# Patient Record
Sex: Male | Born: 1975 | Race: Black or African American | Hispanic: No | Marital: Single | Smoking: Current every day smoker
Health system: Southern US, Community
[De-identification: ages and names within clinical notes are randomized; demographics above are authoritative.]

---

## 2014-12-24 ENCOUNTER — Encounter: Payer: Self-pay | Admitting: Sports Medicine

## 2014-12-24 ENCOUNTER — Ambulatory Visit (INDEPENDENT_AMBULATORY_CARE_PROVIDER_SITE_OTHER): Payer: Managed Care, Other (non HMO)

## 2014-12-24 ENCOUNTER — Ambulatory Visit (INDEPENDENT_AMBULATORY_CARE_PROVIDER_SITE_OTHER): Payer: Managed Care, Other (non HMO) | Admitting: Sports Medicine

## 2014-12-24 VITALS — BP 121/69 | HR 73 | Ht 69.0 in | Wt 126.0 lb

## 2014-12-24 DIAGNOSIS — M62551 Muscle wasting and atrophy, not elsewhere classified, right thigh: Secondary | ICD-10-CM | POA: Insufficient documentation

## 2014-12-24 DIAGNOSIS — M25561 Pain in right knee: Secondary | ICD-10-CM

## 2014-12-24 DIAGNOSIS — M25461 Effusion, right knee: Secondary | ICD-10-CM

## 2014-12-24 DIAGNOSIS — M25462 Effusion, left knee: Secondary | ICD-10-CM

## 2014-12-24 MED ORDER — MELOXICAM 15 MG PO TABS: ORAL_TABLET | ORAL | Status: DC
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
## 2015-01-01 ENCOUNTER — Ambulatory Visit: Payer: Managed Care, Other (non HMO) | Admitting: Physical Therapy

## 2015-01-24 ENCOUNTER — Telehealth: Payer: Self-pay | Admitting: Sports Medicine

## 2015-01-24 NOTE — Telephone Encounter (Signed)
Wanted to let you know he has not been able to go to Physical Therapy due to termination of insurance is waiting on cobra and may not have it by his appt on the 14th

## 2015-02-04 ENCOUNTER — Ambulatory Visit (INDEPENDENT_AMBULATORY_CARE_PROVIDER_SITE_OTHER): Payer: Managed Care, Other (non HMO) | Admitting: Sports Medicine

## 2015-02-04 ENCOUNTER — Ambulatory Visit: Payer: Managed Care, Other (non HMO) | Admitting: Sports Medicine

## 2015-02-04 ENCOUNTER — Encounter: Payer: Self-pay | Admitting: Sports Medicine

## 2015-02-04 DIAGNOSIS — M62551 Muscle wasting and atrophy, not elsewhere classified, right thigh: Secondary | ICD-10-CM

## 2015-02-04 NOTE — Assessment & Plan Note (Signed)
There is an element of osteoarthritis, and he is post-partial meniscectomy and injection with orthopedics. Atrophy is however unusual. He has already done a course of physical therapy before and after his surgery. I would still like him to do some therapy here, he is awaiting insurance. Considering the degree of atrophy we are going to obtain a nerve conduction study and a lecture myography. He denies any claudication in his thigh or calf decreasing the likelihood of critical femoral or popliteal arterial stenosis.

## 2015-02-04 NOTE — Progress Notes (Signed)
  Subjective:    CC:  Follow up of right knee pain  HPI: Patient returns for follow up of his right knee pain. He reports that he has not attended physical therapy as prescribed at his last visit because he lost his insurance coverage, but reports that he had already undergone physical therapy in the past. He recently received an application for [facility] in the mail and will try to establish care [facility] once he receives his [employer] coverage. As for his right knee pain, he reports that it is somewhat improved and he has stopped taking the hydrocodone he was prescribed post-meniscectomy, however he is still unable to use his leg as he would like. He reports that it is weaker than his left and it is especially difficult to use the stairs. He states that his right quadriceps muscle is noticeably smaller than his left.  He denies any back pain, numbness, or claudication in his leg.  Past medical history, Surgical history, Family history not pertinant except as noted below, Social history, Allergies, and medications have been entered into the medical record, reviewed, and no changes needed.   Review of Systems: No fevers, chills, night sweats, weight loss, chest pain, or shortness of breath.   Objective:    General: Well Developed, well nourished, and in no acute distress.  Neuro: Alert and oriented x3, extra-ocular muscles intact, sensation grossly intact.  HEENT: Normocephalic, atraumatic, pupils equal round reactive to light, neck supple, no masses, no lymphadenopathy, thyroid nonpalpable.  Skin: Warm and dry, no rashes. Cardiac: Regular rate and rhythm, no murmurs rubs or gallops, no lower extremity edema.  Respiratory: Clear to auscultation bilaterally. Not using accessory muscles, speaking in full sentences. MSK: Right Knee: Normal to inspection with no erythema or effusion or obvious bony abnormalities. Palpation normal with no warmth, joint line  tenderness, patellar tenderness, or condyle tenderness. ROM full in flexion and extension. Non painful patellar compression. Patellar glide without crepitus. Patellar and quadriceps tendons unremarkable. Hamstring strength 5/5, and quadriceps strength 4/5. Quadriceps with visible atrophy compared to contralateral.   Impression and Recommendations:    # Right Knee Pain - XR knee 12/24/14 with evidence of mild degenerative disease, however patient with significant and disproportional quadriceps muscle wasting despite physical therapy after his surgery - Patient to attend another series of formal physical therapy sessions - Patient referred for electromyography and nerve conduction studies to assess for possible neurological causes of quadriceps muscle wasting - May consider muscle biopsy in the future  Follow up in 6 weeks or sooner as needed

## 2015-02-05 ENCOUNTER — Ambulatory Visit: Payer: Managed Care, Other (non HMO) | Admitting: Sports Medicine

## 2015-03-07 ENCOUNTER — Ambulatory Visit (INDEPENDENT_AMBULATORY_CARE_PROVIDER_SITE_OTHER): Payer: Managed Care, Other (non HMO) | Admitting: Physical Therapy

## 2015-03-07 DIAGNOSIS — R29898 Other symptoms and signs involving the musculoskeletal system: Secondary | ICD-10-CM | POA: Diagnosis not present

## 2015-03-07 DIAGNOSIS — M6259 Muscle wasting and atrophy, not elsewhere classified, multiple sites: Secondary | ICD-10-CM

## 2015-03-07 DIAGNOSIS — M6258 Muscle wasting and atrophy, not elsewhere classified, other site: Secondary | ICD-10-CM

## 2015-03-07 DIAGNOSIS — M6281 Muscle weakness (generalized): Secondary | ICD-10-CM | POA: Diagnosis not present

## 2015-03-07 DIAGNOSIS — R6889 Other general symptoms and signs: Secondary | ICD-10-CM
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
## 2015-03-07 NOTE — Patient Instructions (Addendum)
Piriformis (Supine)  Cross legs, right on top. Gently pull other knee toward chest until stretch is felt in buttock/hip of top leg. Hold _30___ seconds. Repeat __3__ times per set. Do ____ sets per session. Do __2__ sessions per day.   Butterfly, Supine  You can just do the right leg and keep the left leg straight or Lie on back, feet together. Lower knees toward floor. Hold 30__ seconds. Repeat __3_ times per session. Do _2__ sessions per day.  Copyright  VHI. All rights reserved.     Single leg heel tap off phone book. Keep knee straight. 5 reps. 2 sets 1-2x/day  Gary PalmJulie Leean Anthony, PT 03/07/2015 3:36 PM   [facility] [address] [place], [place] [address]  [phone] (office) [phone] (fax)

## 2015-03-11 ENCOUNTER — Ambulatory Visit (INDEPENDENT_AMBULATORY_CARE_PROVIDER_SITE_OTHER): Payer: Managed Care, Other (non HMO) | Admitting: Physical Therapy

## 2015-03-11 DIAGNOSIS — M6281 Muscle weakness (generalized): Secondary | ICD-10-CM | POA: Diagnosis not present

## 2015-03-11 DIAGNOSIS — R29898 Other symptoms and signs involving the musculoskeletal system: Secondary | ICD-10-CM

## 2015-03-11 DIAGNOSIS — M6258 Muscle wasting and atrophy, not elsewhere classified, other site: Secondary | ICD-10-CM

## 2015-03-11 DIAGNOSIS — R6889 Other general symptoms and signs: Secondary | ICD-10-CM

## 2015-03-11 NOTE — Therapy (Signed)
[place], [place], [address] Phone: [phone]   Fax:  [phone]  Physical Therapy Treatment  Patient Details  Name: Gary Anthony MRN: [id number] Date of Birth: [birth year] Referring Provider:  Silverio Decamp,*  Encounter Date: 03/11/2015      PT End of Session - 03/11/15 1119    Visit Number 2   Number of Visits 8   Date for PT Re-Evaluation 04/05/15   PT Start Time 6433   PT Stop Time 2951   PT Time Calculation (min) 43 min   Activity Tolerance Patient tolerated treatment well;No increased pain      No past medical history on file.  No past surgical history on file.  There were no vitals filed for this visit.  Visit Diagnosis:  Generalized muscle weakness  Muscle atrophy of lower extremity  Hip tightness  Activity intolerance      Subjective Assessment - 03/11/15 1119    Subjective Pt reports no knee pain today; just weakness. Pt continues with difficulty leading with RLE with stairs and reports frequent "popping" in Rt knee.    Currently in Pain? No/denies   Pain Score --  Up to 4/10 with carrying case of water up stairs.             [facility] PT Assessment - 03/11/15 0001    Assessment   Medical Diagnosis Rt knee pain    Onset Date 06/05/14   Next MD Visit 04/06/15            [facility] Adult PT Treatment/Exercise - 03/11/15 0001    Knee/Hip Exercises: Stretches   Quad Stretch 1 rep;30 seconds  RLE   Knee/Hip Exercises: Aerobic   Elliptical L3 x 4 min - pt demo Rt knee buckling during, instructed to focus on smooth motion bilat.    Knee/Hip Exercises: Standing   Lateral Step Up 2 sets;Right;10 reps;Hand Hold: 0;Step Height: 6"  cross over step ups.    Forward Step Up 1 set;10 reps;Right;Hand Hold: 0;Step Height: 6"  with 10# in hand   Step Down 1 set;10 reps;Hand Hold: 1;Step Height: 2"  Heel taps.6" very difficult, only able to do 5 rep.    Other  Standing Knee Exercises Attempted sit to stand RLE (unable); Stand to sit with eccentric control x 10 (flops down at end. LLE able to easily perform sit to/from stand.    Knee/Hip Exercises: Supine   Other Supine Knee Exercises Long sitting: Hip flexion with hip Abd <> add x 10 each leg   Other Supine Knee Exercises butterfly stretch 2 x 30 sec.    Knee/Hip Exercises: Sidelying   Hip ADduction Right;1 set;15 reps;Left                PT Education - 03/11/15 1206    Education provided Yes   Education Details HEP.   Pt also educated to have equal stance with sit to/from stand (tends to lean Lt). Pt to ice Rt knee x 15 min as needed for pain relief.    Person(s) Educated Patient   Methods Explanation;Demonstration;Handout   Comprehension Returned demonstration;Verbalized understanding             PT Long Term Goals - 03/07/15 1720    PT LONG TERM GOAL #1   Title I with hep   Time 4   Period Weeks   Status New   PT LONG TERM GOAL #2   Title pt able to perform functional  SLS with proper form   Time 4   Period Weeks   Status New   PT LONG TERM GOAL #3   Title able to ascend stairs with 25# or greater and reciprocal gait.   Time 4   Status New   PT LONG TERM GOAL #4   Title decreased pain in knee with functional activities to 1/10 or less   Time 4   Period Weeks   Status New   PT LONG TERM GOAL #5   Title improved FOTO to 38%   Time 4   Period Weeks   Status New               Plan - 03/11/15 1157    Clinical Impression Statement Pt tolerated all new exercises without any production of Rt knee pain. Pt continues to demo decreased funtional weakness in Rt quad; tremulous with heel taps and unable to perform RLE sit to stand.  No goals met; only second visit.    Pt will benefit from skilled therapeutic intervention in order to improve on the following deficits Impaired flexibility;Pain;Decreased strength;Other (comment)  muscle atrophy   Rehab Potential Good    PT Frequency 2x / week   PT Duration 4 weeks   PT Treatment/Interventions Moist Heat;Patient/family education;Therapeutic exercise;Ultrasound;Manual techniques;Cryotherapy;Neuromuscular re-education;Electrical Stimulation;Other (comment)   PT Next Visit Plan Continue progressive strengthening for Rt quad/adductors.    Consulted and Agree with Plan of Care Patient        Problem List Patient Active Problem List   Diagnosis Date Noted  . Muscle wasting and atrophy, not elsewhere classified, right thigh 12/24/2014   Kerin Perna, PTA 03/11/2015 12:07 PM  [place], [place], [address] Phone: [phone]   Fax:  [phone]

## 2015-03-11 NOTE — Patient Instructions (Signed)
ADDUCTION: Side-Lying (Active)   Lie on right side, with top leg bent and in front of other leg. Lift straight leg up as high as possible. Use _0__ lbs. Complete __2_ sets of _10__ repetitions. Perform _3__ sessions per week.  http://gtsc.exer.us/128   Copyright  VHI. All rights reserved.  Straight Leg Raise: With External Leg Rotation   Lie on back with right leg straight, opposite leg bent. Rotate straight leg out and lift _4___ inches. Repeat _10___ times per set. Do __1__ sets per session. Do __1__ sessions per day.  http://orth.exer.us/728   Copyright  VHI. All rights reserved.  Strengthening: Straight Leg Raise (Phase 3)   Resting on hands, tighten muscles on front of left thigh, then lift leg ___3_ inches from surface, keeping knee locked. Repeat _10___ times per set. Do __2-3__ sets per session. Do _1___ sessions per day.   Single Leg Stand to Sit / Sit to Stand   To sit: Bend knees to lower self onto front edge of chair, then scoot back on seat. To stand: Reverse sequence by placing one foot forward, and scoot to front of seat. Use rocking motion to stand up. * Start stand up with 2 legs and slowly sit down with Right leg  Copyright  VHI. All rights reserved.   http://orth.exer.us/618   Copyright  VHI. All rights reserved.   [facility] [address] [place], [place] [address]  [phone] (fax)

## 2015-03-13 ENCOUNTER — Ambulatory Visit (INDEPENDENT_AMBULATORY_CARE_PROVIDER_SITE_OTHER): Payer: Managed Care, Other (non HMO) | Admitting: Physical Therapy

## 2015-03-13 DIAGNOSIS — M6281 Muscle weakness (generalized): Secondary | ICD-10-CM

## 2015-03-13 DIAGNOSIS — R29898 Other symptoms and signs involving the musculoskeletal system: Secondary | ICD-10-CM

## 2015-03-13 DIAGNOSIS — M6258 Muscle wasting and atrophy, not elsewhere classified, other site: Secondary | ICD-10-CM

## 2015-03-13 DIAGNOSIS — M6259 Muscle wasting and atrophy, not elsewhere classified, multiple sites: Secondary | ICD-10-CM | POA: Diagnosis not present

## 2015-03-13 NOTE — Therapy (Signed)
[facility] [place] [address] [place], [place], [address] Phone: [phone]   Fax:  [phone]  Physical Therapy Treatment  Patient Details  Name: Gary Anthony MRN: [id number] Date of Birth: Aug 08, 1976 Referring Provider:  Monica Becton,*  Encounter Date: 03/13/2015      PT End of Session - 03/13/15 0800    Visit Number 3   Number of Visits 8   Date for PT Re-Evaluation 04/05/15   PT Start Time 0800   PT Stop Time 0846   PT Time Calculation (min) 46 min   Activity Tolerance Patient tolerated treatment well;No increased pain   Behavior During Therapy [facility] for tasks assessed/performed      No past medical history on file.  No past surgical history on file.  There were no vitals filed for this visit.  Visit Diagnosis:  Generalized muscle weakness  Muscle atrophy of lower extremity  Hip tightness      Subjective Assessment - 03/13/15 0803    Subjective Compliant with HEP. Single leg squat very difficult per pt.   Currently in Pain? No/denies                         [facility] Adult PT Treatment/Exercise - 03/13/15 0818    Knee/Hip Exercises: Stretches   Quad Stretch 2 reps;30 seconds   Hip Flexor Stretch 2 reps;30 seconds   Knee/Hip Exercises: Aerobic   Elliptical L3 x 5 min   Knee/Hip Exercises: Standing   Lateral Step Up Right;10 reps;20 reps;Hand Hold: 0;Step Height: 8"   Forward Step Up 2 sets;10 reps;Hand Hold: 0;Step Height: 8"  Step up and step back down. Good control of quad.    Functional Squat 4 sets;5 reps  left foot on two therapads   Functional Squat Limitations end range eccentric weakness but good control with use of pads.   Knee/Hip Exercises: Supine   Other Supine Knee Exercises long sit with hip abd/add x 10   easy.   Knee/Hip Exercises: Sidelying   Hip ABduction --   Hip ADduction Strengthening;Right;2 sets;10 reps  10 second holds   Clams  Strengthening;Right;2 sets;10 reps  2nd set with 10 second hold                PT Education - 03/13/15 0906    Education provided Yes   Education Details hep   Person(s) Educated Patient   Methods Explanation;Demonstration;Handout   Comprehension Verbalized understanding;Returned demonstration             PT Long Term Goals - 03/07/15 1720    PT LONG TERM GOAL #1   Title I with hep   Time 4   Period Weeks   Status New   PT LONG TERM GOAL #2   Title pt able to perform functional SLS with proper form   Time 4   Period Weeks   Status New   PT LONG TERM GOAL #3   Title able to ascend stairs with 25# or greater and reciprocal gait.   Time 4   Status New   PT LONG TERM GOAL #4   Title decreased pain in knee with functional activities to 1/10 or less   Time 4   Period Weeks   Status New   PT LONG TERM GOAL #5   Title improved FOTO to 38%   Time 4   Period Weeks   Status New  Plan - 03/13/15 0908    Clinical Impression Statement Pt tolerated exercises very well and without pain. Continues to demo glut med weakness with eccentric quad activities.   PT Next Visit Plan continue progressive strengthening for Rt quads, glut med and adductors.        Problem List Patient Active Problem List   Diagnosis Date Noted  . Muscle wasting and atrophy, not elsewhere classified, right thigh 12/24/2014    Solon PalmJulie Tawfiq Favila PT 03/13/2015, 10:01 AM  [facility] [address] [place], [place], [address] Phone: [phone]   Fax:  [phone]

## 2015-03-13 NOTE — Patient Instructions (Addendum)
  BACK: Hip Flexor Stretch   Interlace fingers on top of right knee. Shift weight forward. Continue breathing normally and hold position for _30seconds. Do 3 x. Repeat on other leg.  Do _2__ times per day.  Clam   Lie on side, legs bent 90. Open top knee to ceiling, rotating leg outward. DO NOT ROLL HIP BACKWARD. Keep feet touching. Hold 10 seconds.  Repeat 10-20___ times. Do __2__ sessions per day.  Copyright  VHI. All rights reserved.   Squat: Stable - Full (Active)    Place your left foot on a 6 inch step. Right foot on floor. Stand in front of a chair. From erect position, head up and back flat, push buttocks backward, lower slowly until thighs are at least parallel to floor or hit the chair. Rise back to an erect position. No weight. Complete __4_ sets of _5__ repetitions. Perform _1-2__ sessions per day.  Copyright  VHI. All rights reserved.     Copyright  VHI. All rights reserved.      Solon PalmJulie Latonga Ponder, PT 03/13/2015 8:36 AM  [facility] [place] [address] [place], [place] [address]  [phone] (office) [phone] (fax)

## 2015-03-18 ENCOUNTER — Ambulatory Visit: Payer: Managed Care, Other (non HMO) | Admitting: Sports Medicine

## 2015-03-19 ENCOUNTER — Ambulatory Visit (INDEPENDENT_AMBULATORY_CARE_PROVIDER_SITE_OTHER): Payer: Managed Care, Other (non HMO) | Admitting: Physical Therapy

## 2015-03-19 DIAGNOSIS — M6281 Muscle weakness (generalized): Secondary | ICD-10-CM

## 2015-03-19 DIAGNOSIS — M6259 Muscle wasting and atrophy, not elsewhere classified, multiple sites: Secondary | ICD-10-CM

## 2015-03-19 DIAGNOSIS — M6258 Muscle wasting and atrophy, not elsewhere classified, other site: Secondary | ICD-10-CM

## 2015-03-19 DIAGNOSIS — R6889 Other general symptoms and signs: Secondary | ICD-10-CM

## 2015-03-19 DIAGNOSIS — R29898 Other symptoms and signs involving the musculoskeletal system: Secondary | ICD-10-CM

## 2015-03-19 NOTE — Patient Instructions (Addendum)
Piriformis Stretch, Supine   Lie supine, one ankle crossed onto opposite knee. Holding bottom leg behind knee, gently pull legs toward chest until stretch is felt in buttock of top leg. Hold _30__ seconds. For deeper stretch gently push top knee away from body.  Repeat _2__ times per session. Do _1-2__ sessions per day.    Copyright  VHI. All rights reserved.  Outer Hip Stretch: Reclined IT Band Stretch (Strap)   Strap around opposite foot, pull across only as far as possible with shoulders on mat. Hold for _5___ breaths. Repeat __2-3__ times each leg.  Copyright  VHI. All rights reserved.   [facility] [address] [place], [place] [address]  [phone] (office) [phone] (fax)

## 2015-03-19 NOTE — Therapy (Signed)
[place], [place], [address] Phone: [phone]   Fax:  [phone]  Physical Therapy Treatment  Patient Details  Name: Gary Anthony MRN: [id number] Date of Birth: [birth year] Referring Provider:  Silverio Decamp,*  Encounter Date: 03/19/2015      PT End of Session - 03/19/15 0807    Visit Number 4   Number of Visits 8   Date for PT Re-Evaluation 04/05/15   PT Start Time 0804   PT Stop Time 6301   PT Time Calculation (min) 43 min   Activity Tolerance Patient tolerated treatment well;No increased pain      No past medical history on file.  No past surgical history on file.  There were no vitals filed for this visit.  Visit Diagnosis:  Generalized muscle weakness  Muscle atrophy of lower extremity  Hip tightness  Activity intolerance      Subjective Assessment - 03/19/15 0807    Subjective Pt reports soreness in Rt hip from exercises.  Exercises going well, getting a little easier.    Currently in Pain? No/denies            [facility] PT Assessment - 03/19/15 0001    Assessment   Medical Diagnosis Rt knee pain    Onset Date 06/05/14   Next MD Visit 04/06/15                     [facility] Adult PT Treatment/Exercise - 03/19/15 0001    Knee/Hip Exercises: Stretches   ITB Stretch 30 seconds;2 reps   Piriformis Stretch 30 seconds;2 reps   Knee/Hip Exercises: Aerobic   Tread Mill 3.38mh x 5 min    Knee/Hip Exercises: Machines for Strengthening   Cybex Leg Press 6 plates x 10 with RLE only x 2 sets  (challenge for 2nd set)   Knee/Hip Exercises: Standing   Lateral Step Up 10 reps;2 sets;Right  cross over step ups   Step Down 1 set;Right;15 reps  with backward step ups   SLS On grey disc x 30 sec each leg x 2 sets   Other Standing Knee Exercises Steps (20): reciprocal pattern carrying 10#, 20#, 30# x 3 reps of each . Trialed SL squat RLE- still plops down and  unable to ascend with RLE.    Other Standing Knee Exercises Push/pull sled with 60#  40 ft x 6 reps    Knee/Hip Exercises: Sidelying   Hip ABduction Strengthening;Right;1 set;10 reps  with 3# on ankle.                 PT Education - 03/19/15 0904    Education provided Yes   Education Details HEP- added stretches    Person(s) Educated Patient   Methods Handout;Explanation   Comprehension Verbalized understanding;Returned demonstration             PT Long Term Goals - 03/19/15 0816    PT LONG TERM GOAL #1   Title I with hep   Time 4   Period Weeks   Status On-going   PT LONG TERM GOAL #2   Title pt able to perform functional Single Leg squat with proper form   Time 4   Period Weeks   Status On-going  modified 03/19/15; discussed with supervising PT.    PT LONG TERM GOAL #3   Title able to ascend stairs with 25# or greater and reciprocal gait.   Time 4   Status Achieved  PT LONG TERM GOAL #4   Title decreased pain in knee with functional activities to 1/10 or less   Time 4   Period Weeks   Status On-going   PT LONG TERM GOAL #5   Title improved FOTO to 38%   Time 4   Period Weeks   Status On-going               Plan - 03/19/15 4327    Clinical Impression Statement Pt able to tolerate new exercises with increased difficulty/resistance without any Rt knee pain. Pt continues with weak quad with eccentric activities.  Pt has met LTG # 3.      Pt will benefit from skilled therapeutic intervention in order to improve on the following deficits Impaired flexibility;Pain;Decreased strength;Other (comment)   Rehab Potential Good   PT Frequency 2x / week   PT Duration 4 weeks   PT Treatment/Interventions Moist Heat;Patient/family education;Therapeutic exercise;Ultrasound;Manual techniques;Cryotherapy;Neuromuscular re-education;Electrical Stimulation;Other (comment)   PT Next Visit Plan continue progressive strengthening for Rt quads, glut med and adductors.     Consulted and Agree with Plan of Care Patient        Problem List Patient Active Problem List   Diagnosis Date Noted  . Muscle wasting and atrophy, not elsewhere classified, right thigh 12/24/2014   Kerin Perna, PTA 03/19/2015 9:04 AM  [place] [address] [address], [place], [address] Phone: [phone]   Fax:  [phone]

## 2015-03-22 ENCOUNTER — Encounter: Payer: Managed Care, Other (non HMO) | Admitting: Physical Therapy

## 2015-03-29 ENCOUNTER — Ambulatory Visit (INDEPENDENT_AMBULATORY_CARE_PROVIDER_SITE_OTHER): Payer: Managed Care, Other (non HMO) | Admitting: Physical Therapy

## 2015-03-29 DIAGNOSIS — R29898 Other symptoms and signs involving the musculoskeletal system: Secondary | ICD-10-CM

## 2015-03-29 DIAGNOSIS — M6259 Muscle wasting and atrophy, not elsewhere classified, multiple sites: Secondary | ICD-10-CM | POA: Diagnosis not present

## 2015-03-29 DIAGNOSIS — R6889 Other general symptoms and signs: Secondary | ICD-10-CM

## 2015-03-29 DIAGNOSIS — M6281 Muscle weakness (generalized): Secondary | ICD-10-CM

## 2015-03-29 DIAGNOSIS — M6258 Muscle wasting and atrophy, not elsewhere classified, other site: Secondary | ICD-10-CM

## 2015-03-29 NOTE — Patient Instructions (Addendum)
Balance / Reach   Stand on left foot, Holding _0___ pound weight in other hand. Bend knee, lowering body, and reach across. Hold _0___ seconds. Relax. Repeat _10___ times per set. Do _1-2___ sets per session. Do __1__ sessions 3x/wk .  http://orth.exer.us/90   Copyright  VHI. All rights reserved.  Side Step Abduction With Exercise Band   Toes pointed straight ahead, squat. Band at ankles Side step __10_ feet to each side, repeat 2x. Do _1__ times per day.  http://ss.exer.us/158   Copyright  VHI. All rights reserved.    [facility] [place] [address] [place], [place] [address]  [phone] (office) [phone] (fax)

## 2015-03-29 NOTE — Therapy (Addendum)
[place], [place], [address] Phone: [phone]   Fax:  [phone]  Physical Therapy Treatment  Patient Details  Name: Gary Anthony MRN: [id number] Date of Birth: 1976/04/03 Referring Provider:  Silverio Decamp,*  Encounter Date: 03/29/2015      PT End of Session - 03/29/15 1447    Visit Number 5   Number of Visits 8   Date for PT Re-Evaluation 04/05/15   PT Start Time 1443   PT Stop Time 1531   PT Time Calculation (min) 45 min   Activity Tolerance Patient tolerated treatment well      No past medical history on file.  No past surgical history on file.  There were no vitals filed for this visit.  Visit Diagnosis:  Generalized muscle weakness  Muscle atrophy of lower extremity  Hip tightness  Activity intolerance      Subjective Assessment - 03/29/15 1447    Subjective Pt reports he feels he is getting stronger with current HEP.     Currently in Pain? No/denies            [facility] PT Assessment - 03/29/15 0001    Assessment   Medical Diagnosis Rt knee pain    Onset Date 06/05/14   Next MD Visit 04/06/15   Strength   Strength Assessment Site Hip   Right Hip Flexion 5/5   Right Hip ABduction 5/5   Left Hip Flexion 5/5   Left Hip ABduction 5/5   Left Hip ADduction 5/5                     [facility] Adult PT Treatment/Exercise - 03/29/15 0001    Knee/Hip Exercises: Stretches   Piriformis Stretch 30 seconds   Knee/Hip Exercises: Aerobic   Elliptical L3 x 5 min   Knee/Hip Exercises: Machines for Strengthening   Cybex Leg Press 9 plates with BLE and green band for hip abd x 10 reps; 6 plates for RLE x 15    Knee/Hip Exercises: Standing   Step Down 1 set;Right;10 reps  with backward step ups 8" no UE.    SLS On Bosu RLE x 30 sec (with occasional UE to steady) x  2 reps;  forward SLS leans with hand touch to floor x 8 reps each leg    Other Standing Knee  Exercises single leg squat x 6 reps (required LLE for ascent; with 3.5 step on mat x 6  reps (able to complete) ; side stepping with blue band at ankles x 10 ft x 4 reps.    Other Standing Knee Exercises Push/pull sled with 80#  40 ft x 3 reps, 105#  x 40 ft x 2 reps     Knee/Hip Exercises: Supine   Other Supine Knee Exercises long sit with hip abd/add x 10    Knee/Hip Exercises: Sidelying   Hip ADduction Right;1 set;10 reps                PT Education - 03/29/15 1627    Education provided Yes   Education Details HEP- added side step with band and forward leans in SLS (touch floor with hand)    Person(s) Educated Patient   Methods Handout;Explanation   Comprehension Verbalized understanding;Returned demonstration             PT Long Term Goals - 03/29/15 1633    PT LONG TERM GOAL #1   Title I with hep   Time  4   Period Weeks   Status On-going   PT LONG TERM GOAL #2   Title pt able to perform functional Single Leg squat with proper form   Time 4   Period Weeks   Status On-going   PT LONG TERM GOAL #3   Title able to ascend stairs with 25# or greater and reciprocal gait.   Time 4   Period Weeks   Status Achieved   PT LONG TERM GOAL #4   Title decreased pain in knee with functional activities to 1/10 or less   Time 4   Period Weeks   Status Achieved   PT LONG TERM GOAL #5   Title improved FOTO to 38%   Time 4   Period Weeks   Status On-going               Plan - 03/29/15 1627    Clinical Impression Statement Pt making great progress towards goals demonstrating improved strength with SL squat RLE (however, still very difficult compared to LLE). Pt tolerated all exercises without increase in Rt knee pain. Pt has met LTG #4.    Pt will benefit from skilled therapeutic intervention in order to improve on the following deficits Impaired flexibility;Pain;Decreased strength;Other (comment)   Rehab Potential Good   PT Frequency 2x / week   PT Duration 4  weeks   PT Treatment/Interventions Moist Heat;Patient/family education;Therapeutic exercise;Ultrasound;Manual techniques;Cryotherapy;Neuromuscular re-education;Electrical Stimulation;Other (comment)   PT Next Visit Plan Assess need for continued therapy vs d/c to HEP.    Consulted and Agree with Plan of Care Patient        Problem List Patient Active Problem List   Diagnosis Date Noted  . Muscle wasting and atrophy, not elsewhere classified, right thigh 12/24/2014   Kerin Perna, PTA 03/29/2015 4:33 PM  [facility] [place] [address] [place], [place], [address] Phone: [phone]   Fax:  [phone]     PHYSICAL THERAPY DISCHARGE SUMMARY  Visits from Start of Care: 5  Current functional level related to goals / functional outcomes: unknown   Remaining deficits: unknown   Education / Equipment: HEP Plan:                                                    Patient goals were partially met. Patient is being discharged due to not returning since the last visit.  ?????    Jeral Pinch, PT 04/25/2015 5:10 PM

## 2015-04-01 ENCOUNTER — Encounter: Payer: Managed Care, Other (non HMO) | Admitting: Physical Therapy

## 2015-04-30 ENCOUNTER — Ambulatory Visit (INDEPENDENT_AMBULATORY_CARE_PROVIDER_SITE_OTHER): Payer: Managed Care, Other (non HMO)

## 2015-04-30 ENCOUNTER — Encounter: Payer: Self-pay | Admitting: Sports Medicine

## 2015-04-30 ENCOUNTER — Ambulatory Visit (INDEPENDENT_AMBULATORY_CARE_PROVIDER_SITE_OTHER): Payer: Managed Care, Other (non HMO) | Admitting: Sports Medicine

## 2015-04-30 VITALS — BP 118/71 | HR 60 | Ht 70.0 in | Wt 134.0 lb

## 2015-04-30 DIAGNOSIS — S62602A Fracture of unspecified phalanx of right middle finger, initial encounter for closed fracture: Secondary | ICD-10-CM

## 2015-04-30 DIAGNOSIS — X58XXXA Exposure to other specified factors, initial encounter: Secondary | ICD-10-CM | POA: Diagnosis not present

## 2015-04-30 DIAGNOSIS — S63612D Unspecified sprain of right middle finger, subsequent encounter: Secondary | ICD-10-CM

## 2015-04-30 DIAGNOSIS — S63612A Unspecified sprain of right middle finger, initial encounter: Secondary | ICD-10-CM | POA: Diagnosis not present

## 2015-04-30 DIAGNOSIS — S62622A Displaced fracture of medial phalanx of right middle finger, initial encounter for closed fracture: Secondary | ICD-10-CM | POA: Diagnosis not present

## 2015-04-30 DIAGNOSIS — M62551 Muscle wasting and atrophy, not elsewhere classified, right thigh: Secondary | ICD-10-CM | POA: Diagnosis not present

## 2015-04-30 DIAGNOSIS — IMO0001 Reserved for inherently not codable concepts without codable children: Secondary | ICD-10-CM | POA: Insufficient documentation

## 2015-04-30 MED ORDER — DICLOFENAC SODIUM 2 % TD SOLN: 2.0000 | Freq: Two times a day (BID) | TRANSDERMAL | Status: AC

## 2015-04-30 NOTE — Assessment & Plan Note (Signed)
Nerve conduction study shows no evidence of radiculopathy, neuropathy, or myopathy. Symptoms were predominantly due to disuse, aggressive physical therapy has been very helpful. There is some element of knee osteoarthritis we are going to add topical diclofenac.

## 2015-04-30 NOTE — Assessment & Plan Note (Addendum)
Swelling and pain after spraining finger 2-3 weeks ago. Pain is predominantly at the third proximal interphalangeal joint. Buddy taped to the fourth finger, x-rays, treatment will depend on x-ray results.  X-rays show a small avulsion from the volar base of the third middle phalanx. Continue taping  I billed a fracture code for this encounter, all subsequent visits will be post-op checks in the global period.

## 2015-04-30 NOTE — Progress Notes (Signed)
  Subjective:    CC: Follow-up  HPI: Right thigh atrophy: Nerve conduction study showed no evidence of neuropathy, radiculopathy, or myopathy, this is simply disuse injury, he's done over a month now physical therapy and is feeling significantly better, most days are pain free, he does get some pain in his knee which changes in the weather but otherwise no mechanical symptoms and no limitations.  2 weeks ago he did sprain his right third finger, now has significant swelling of the third proximal interphalangeal joint without radiation.  Past medical history, Surgical history, Family history not pertinant except as noted below, Social history, Allergies, and medications have been entered into the medical record, reviewed, and no changes needed.   Review of Systems: No fevers, chills, night sweats, weight loss, chest pain, or shortness of breath.   Objective:    General: Well Developed, well nourished, and in no acute distress.  Neuro: Alert and oriented x3, extra-ocular muscles intact, sensation grossly intact.  HEENT: Normocephalic, atraumatic, pupils equal round reactive to light, neck supple, no masses, no lymphadenopathy, thyroid nonpalpable.  Skin: Warm and dry, no rashes. Cardiac: Regular rate and rhythm, no murmurs rubs or gallops, no lower extremity edema.  Respiratory: Clear to auscultation bilaterally. Not using accessory muscles, speaking in full sentences. Right Knee: Normal to inspection with no erythema or effusion or obvious bony abnormalities. Quadriceps are improving in appearance, more symmetric. Palpation normal with no warmth or joint line tenderness or patellar tenderness or condyle tenderness. ROM normal in flexion and extension and lower leg rotation. Ligaments with solid consistent endpoints including ACL, PCL, LCL, MCL. Negative Mcmurray's and provocative meniscal tests. Non painful patellar compression. Patellar and quadriceps tendons unremarkable. Hamstring and  quadriceps strength is normal. Right hand: Swollen third PIP, stable collaterals, good motion. Tender to palpation, but he takes the third and fourth fingers together.  X-rays reviewed and show a small avulsion from the volar base of the middle phalanx.  Impression and Recommendations:

## 2016-05-24 IMAGING — DX DG KNEE COMPLETE 4+V*R*
4 series · 4 of 4 positions shown · non-contrast
Comparison: No prior.

CLINICAL DATA: Right knee pain. Initial evaluation. No reported
history of injury.Prior meniscus repair.

EXAM:
Right KNEE 2 VIEWS

[knee ap]
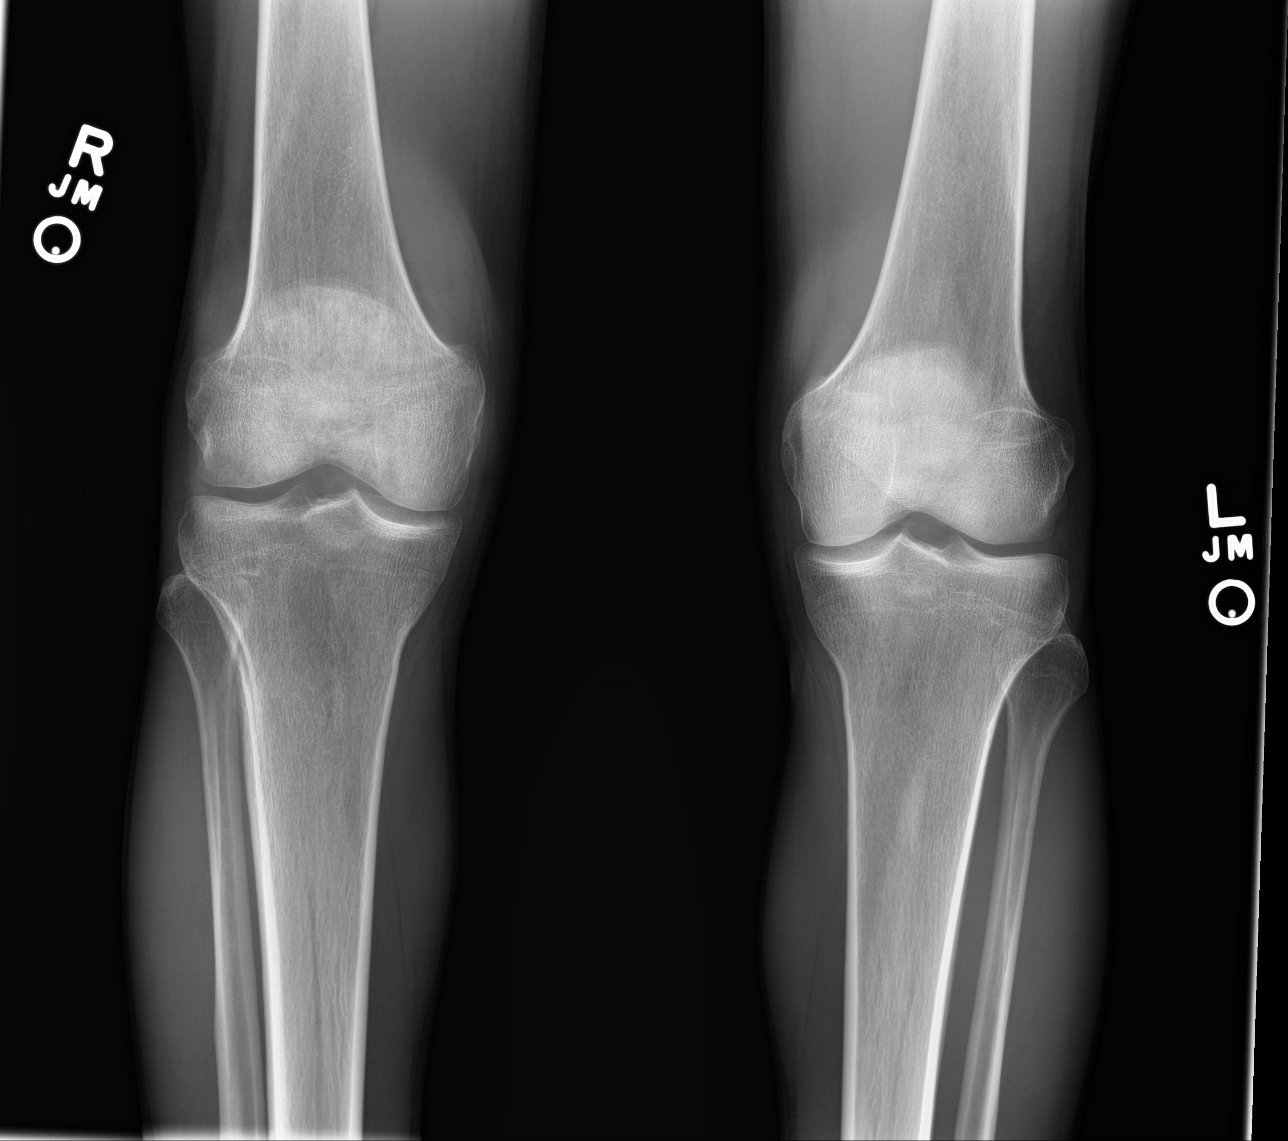

[[person_name]]
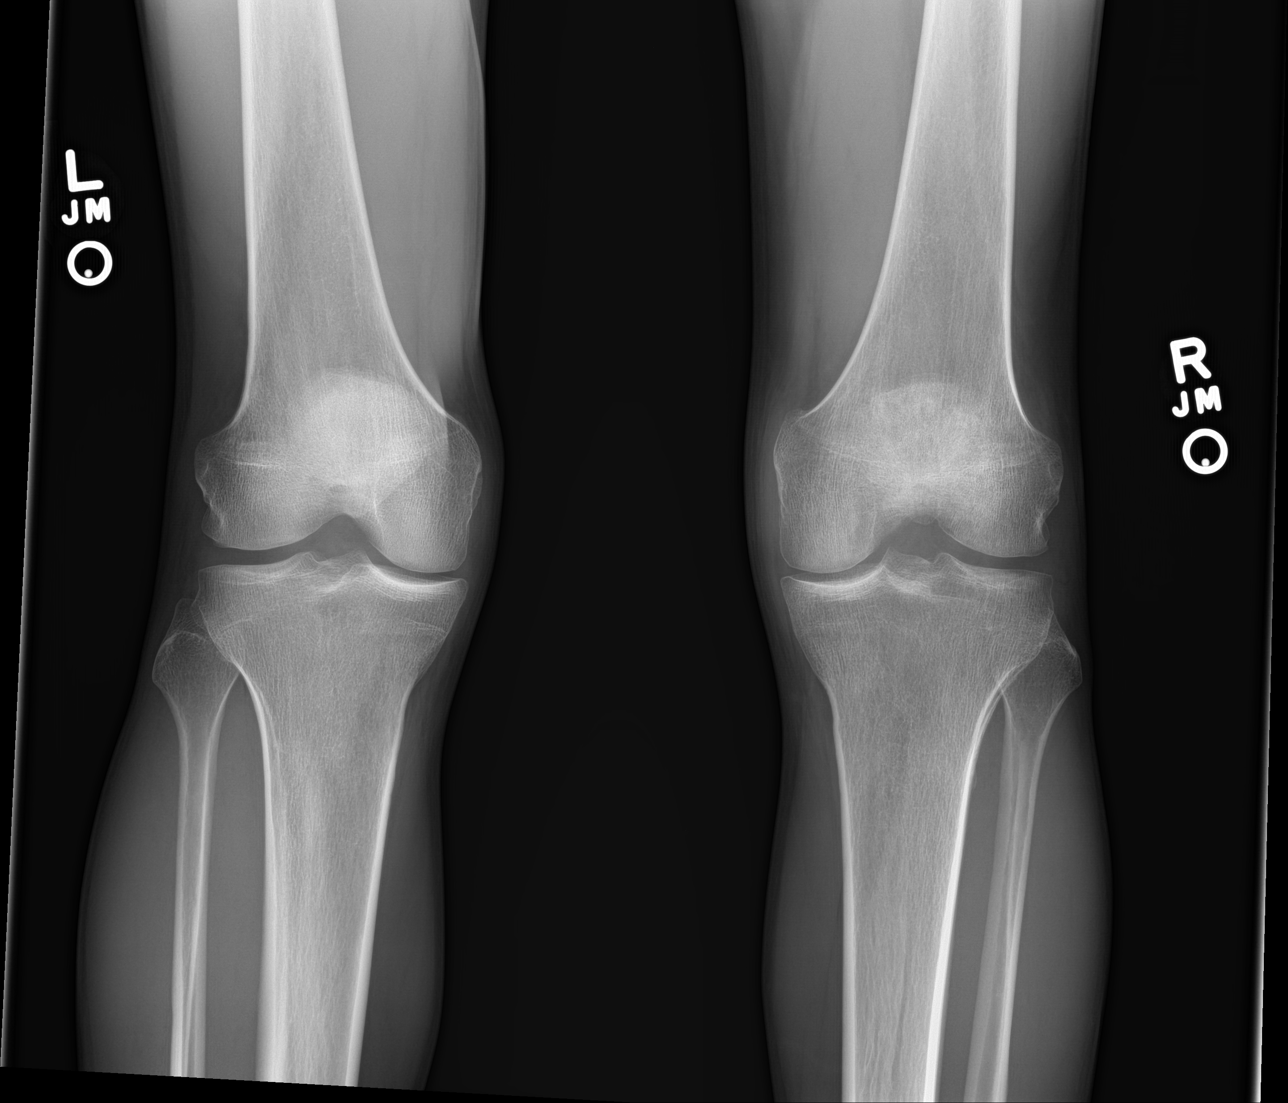

[knee lat]
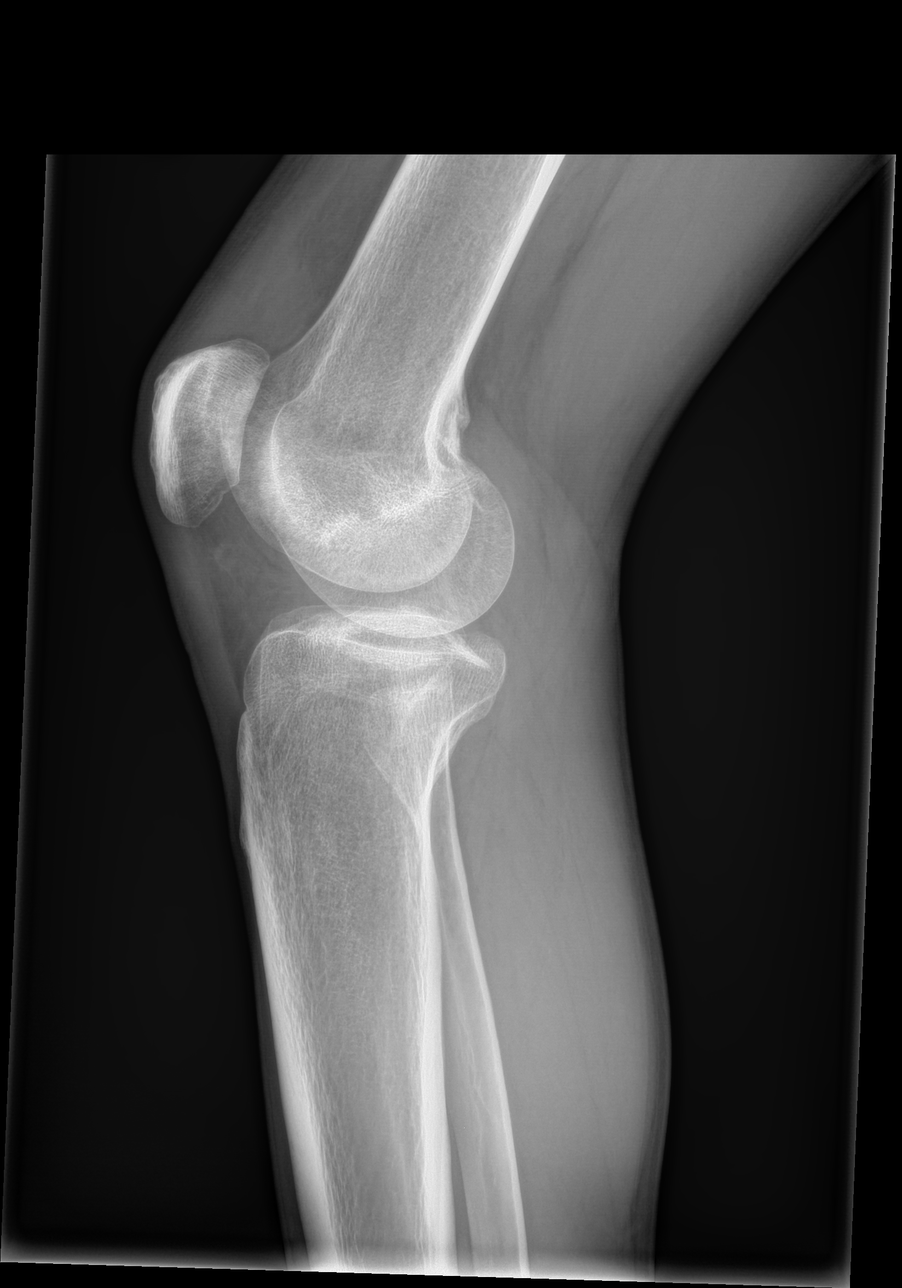

[patella skyline]
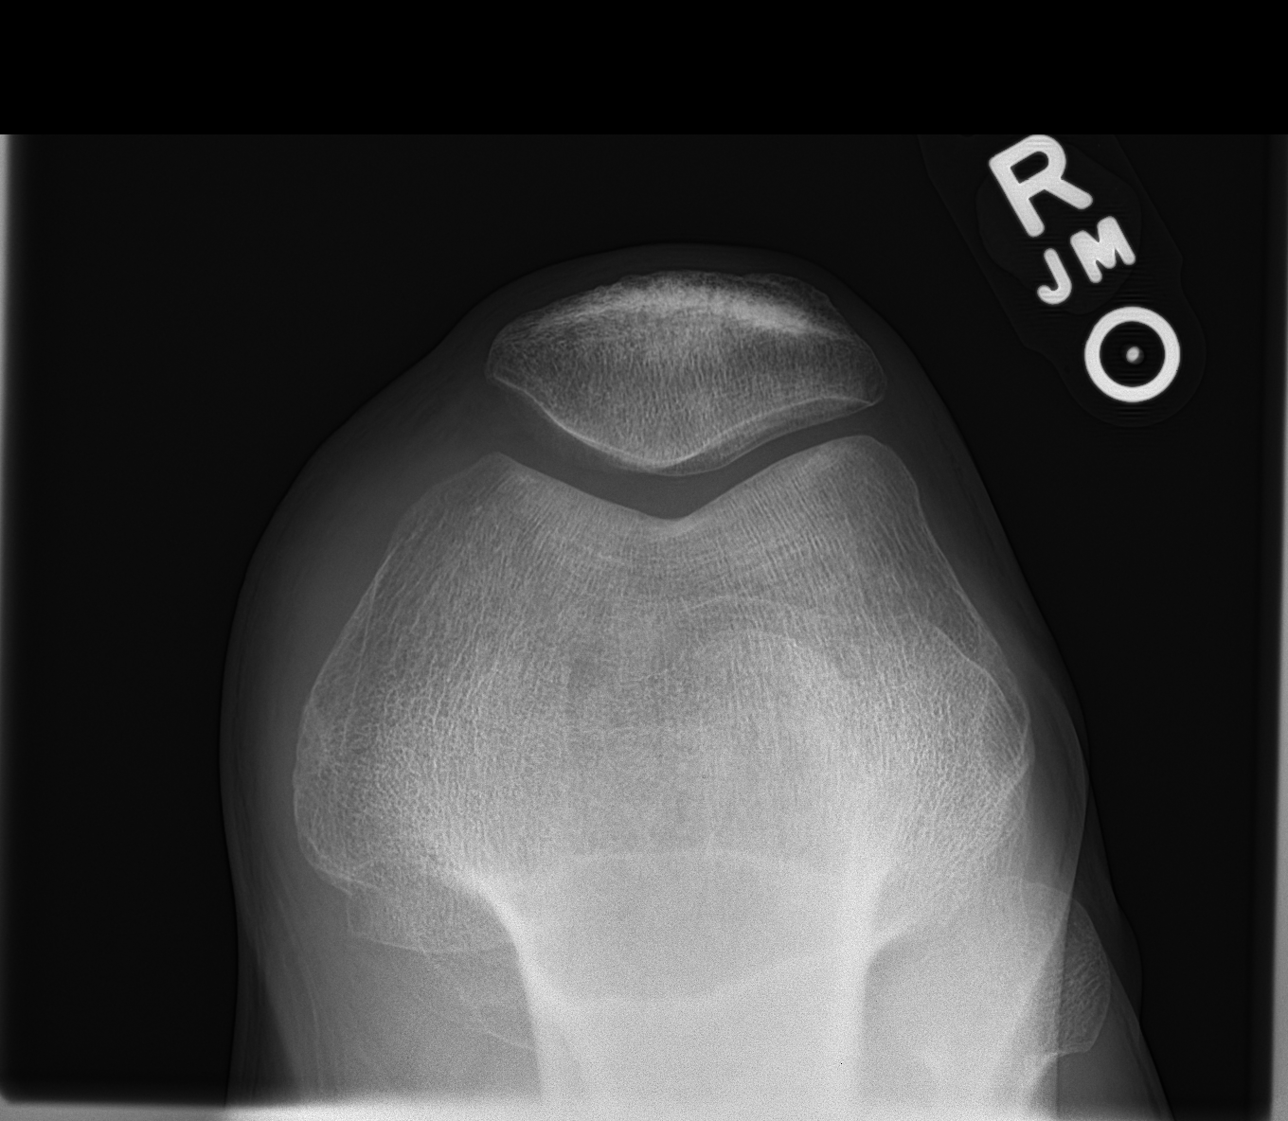

[4 of 4 positions shown; findings below may reference images not displayed]

FINDINGS: Small knee joint effusion cannot be excluded. No evidence of
fracture or dislocation. Mild medial joint space loss cannot be
excluded. This may be degenerative .
IMPRESSION: 1. Small knee joint effusion cannot be excluded.
2. Mild medial joint space loss noted. This may be degenerative. No
evidence of fracture or dislocation.

## 2016-09-28 IMAGING — CR DG FINGER MIDDLE 2+V*R*
3 series · 3 of 3 positions shown · non-contrast
Comparison: None.

CLINICAL DATA: Jammed finger several weeks ago with pain and
swelling

EXAM:
RIGHT MIDDLE FINGER 2+V

[finger ap]
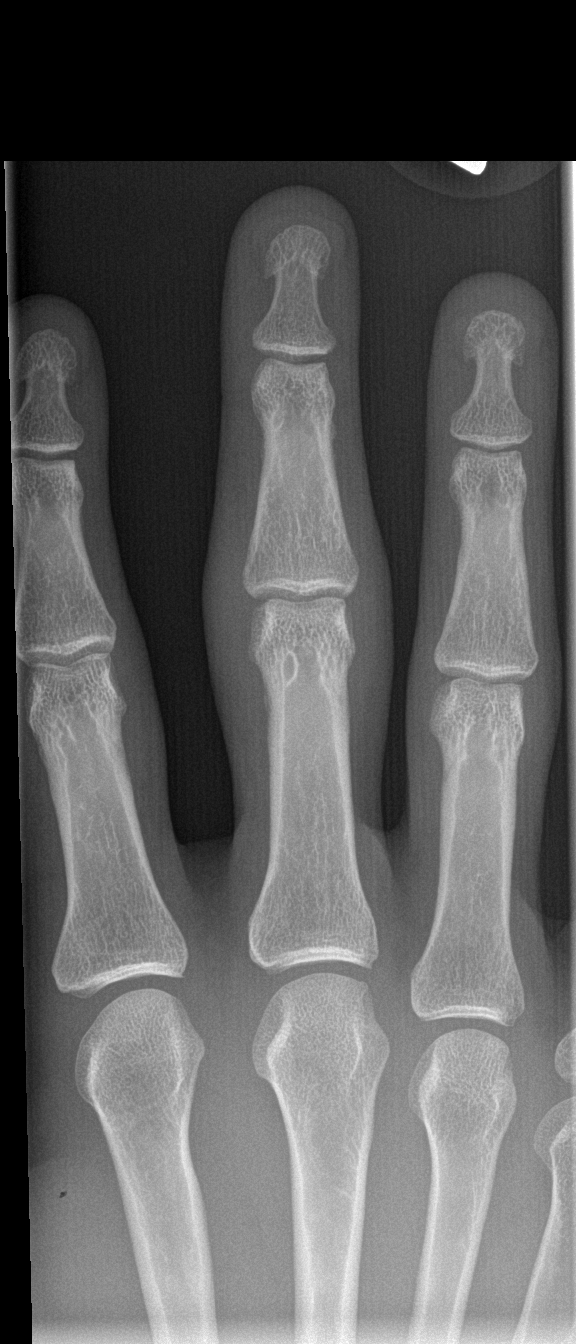

[finger obl]
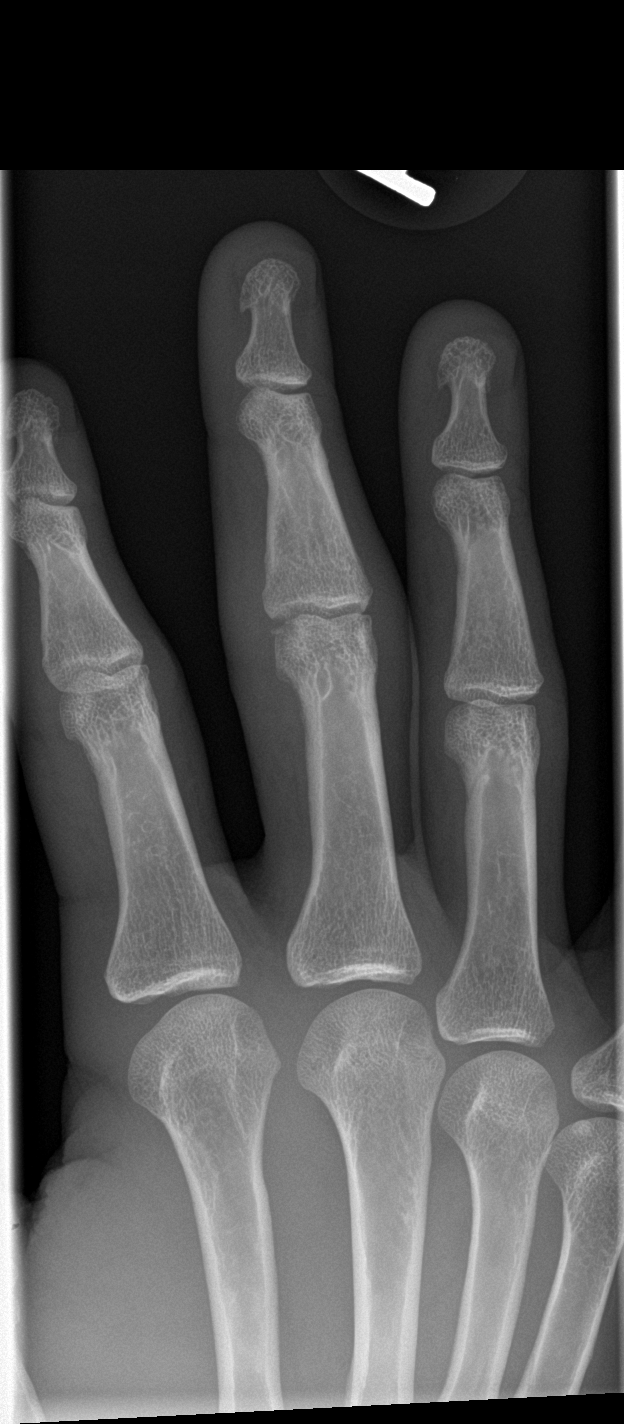

[finger lat]
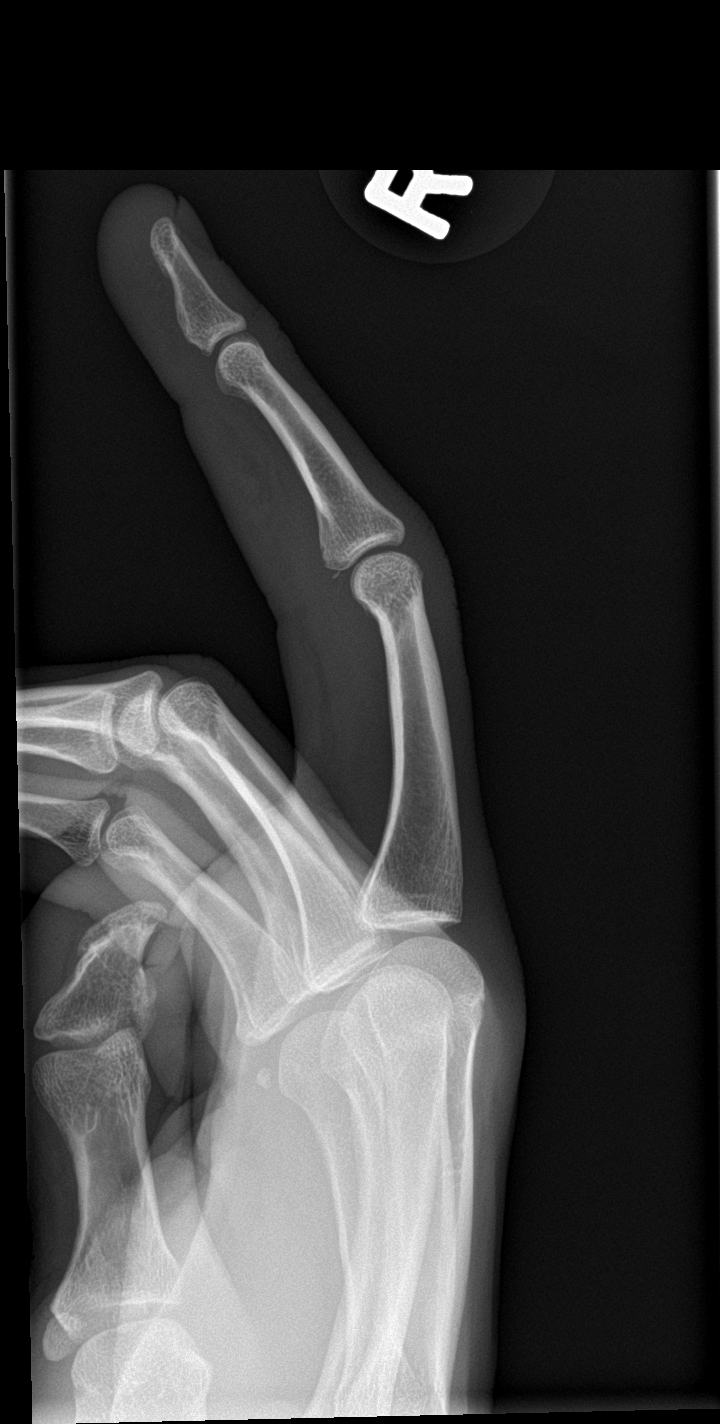

[3 of 3 positions shown; findings below may reference images not displayed]

FINDINGS: There is a small avulsion fracture fragment probably from the base
of the middle phalanx of the right third digit with soft tissue
swelling. No other acute abnormality is seen. Alignment is normal.
IMPRESSION: Small avulsion fracture fragment at of the right third PIP joint
with soft tissue swelling.

## 2024-11-17 ENCOUNTER — Other Ambulatory Visit (HOSPITAL_COMMUNITY): Payer: Self-pay
